# Patient Record
Sex: Male | Born: 1970 | Race: Black or African American | Hispanic: No | Marital: Married | Smoking: Never smoker
Health system: Southern US, Community
[De-identification: ages and names within clinical notes are randomized; demographics above are authoritative.]

## PROBLEM LIST (undated history)

## (undated) DIAGNOSIS — D649 Anemia, unspecified: Secondary | ICD-10-CM

## (undated) DIAGNOSIS — E0500 Thyrotoxicosis with diffuse goiter without thyrotoxic crisis or storm: Secondary | ICD-10-CM

## (undated) DIAGNOSIS — E079 Disorder of thyroid, unspecified: Secondary | ICD-10-CM

## (undated) DIAGNOSIS — I10 Essential (primary) hypertension: Secondary | ICD-10-CM

## (undated) HISTORY — DX: Thyrotoxicosis with diffuse goiter without thyrotoxic crisis or storm: E05.00

## (undated) HISTORY — PX: TOE AMPUTATION: SHX809

## (undated) HISTORY — PX: KNEE ARTHROSCOPY: SUR90

## (undated) HISTORY — PX: INGUINAL HERNIA REPAIR: SUR1180

## (undated) HISTORY — PX: HERNIA REPAIR: SHX51

## (undated) HISTORY — DX: Disorder of thyroid, unspecified: E07.9

## (undated) HISTORY — PX: SHOULDER ARTHROSCOPY: SHX128

---

## 2000-12-19 ENCOUNTER — Encounter: Payer: Self-pay | Admitting: Emergency Medicine

## 2000-12-19 ENCOUNTER — Emergency Department (HOSPITAL_COMMUNITY): Admission: EM | Admit: 2000-12-19 | Discharge: 2000-12-19 | Payer: Self-pay | Admitting: Emergency Medicine

## 2005-05-14 ENCOUNTER — Emergency Department (HOSPITAL_COMMUNITY): Admission: EM | Admit: 2005-05-14 | Discharge: 2005-05-14 | Payer: Self-pay | Admitting: Emergency Medicine

## 2007-01-18 ENCOUNTER — Emergency Department (HOSPITAL_COMMUNITY): Admission: EM | Admit: 2007-01-18 | Discharge: 2007-01-18 | Payer: Self-pay | Admitting: Emergency Medicine
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
## 2010-03-20 ENCOUNTER — Ambulatory Visit (HOSPITAL_COMMUNITY): Admission: RE | Admit: 2010-03-20 | Discharge: 2010-03-20 | Payer: Self-pay | Admitting: Surgery

## 2010-08-05 LAB — SURGICAL PCR SCREEN: Staphylococcus aureus: POSITIVE — AB

## 2013-12-10 DIAGNOSIS — Z0000 Encounter for general adult medical examination without abnormal findings: Secondary | ICD-10-CM | POA: Insufficient documentation

## 2014-01-14 DIAGNOSIS — K4090 Unilateral inguinal hernia, without obstruction or gangrene, not specified as recurrent: Secondary | ICD-10-CM | POA: Insufficient documentation

## 2014-01-14 DIAGNOSIS — M6740 Ganglion, unspecified site: Secondary | ICD-10-CM | POA: Insufficient documentation

## 2015-01-31 DIAGNOSIS — D171 Benign lipomatous neoplasm of skin and subcutaneous tissue of trunk: Secondary | ICD-10-CM | POA: Insufficient documentation

## 2017-03-16 ENCOUNTER — Other Ambulatory Visit: Payer: Self-pay | Admitting: Surgery

## 2017-04-18 SURGERY — Surgical Case
Anesthesia: *Unknown

## 2019-07-10 DIAGNOSIS — E0590 Thyrotoxicosis, unspecified without thyrotoxic crisis or storm: Secondary | ICD-10-CM | POA: Insufficient documentation

## 2019-10-12 ENCOUNTER — Other Ambulatory Visit: Payer: Self-pay | Admitting: Surgery

## 2019-12-19 NOTE — Progress Notes (Signed)
LVM to inform the pt of the Covid testing site change of location to [facility]. Please show up at the scheduled time, and this new site is still a drive- thru. Please contact your ordering provider with any other questions/concerns.

## 2019-12-26 ENCOUNTER — Encounter (HOSPITAL_COMMUNITY)
Admission: RE | Admit: 2019-12-26 | Discharge: 2019-12-26 | Disposition: A | Discharge status: Home / Self Care | Payer: BLUE CROSS/BLUE SHIELD | Source: Ambulatory Visit | Attending: Surgery | Admitting: Surgery

## 2019-12-26 ENCOUNTER — Other Ambulatory Visit: Payer: Self-pay

## 2019-12-26 ENCOUNTER — Encounter (HOSPITAL_COMMUNITY): Payer: Self-pay

## 2019-12-26 DIAGNOSIS — Z01818 Encounter for other preprocedural examination: Secondary | ICD-10-CM | POA: Diagnosis present

## 2019-12-26 HISTORY — DX: Essential (primary) hypertension: I10

## 2019-12-26 HISTORY — DX: Anemia, unspecified: D64.9

## 2019-12-26 LAB — BASIC METABOLIC PANEL
Anion gap: 8 (ref 5–15)
BUN: 9 mg/dL (ref 6–20)
CO2: 28 mmol/L (ref 22–32)
Calcium: 9.3 mg/dL (ref 8.9–10.3)
Chloride: 99 mmol/L (ref 98–111)
Creatinine, Ser: 1.04 mg/dL (ref 0.61–1.24)
GFR calc Af Amer: >60 mL/min (ref >60)
GFR calc non Af Amer: >60 mL/min (ref >60)
Glucose, Bld: 89 mg/dL (ref 70–99)
Potassium: 4 mmol/L (ref 3.5–5.1)
Sodium: 135 mmol/L (ref 135–145)

## 2019-12-26 LAB — CBC
HCT: 48.3 % (ref 39.0–52.0)
Hemoglobin: 15.7 g/dL (ref 13.0–17.0)
MCH: 26.7 pg (ref 26.0–34.0)
MCHC: 32.5 g/dL (ref 30.0–36.0)
MCV: 82.3 fL (ref 80.0–100.0)
Platelets: 224 10*3/uL (ref 150–400)
RBC: 5.87 MIL/uL — ABNORMAL HIGH (ref 4.22–5.81)
RDW: 14.6 % (ref 11.5–15.5)
WBC: 4.7 10*3/uL (ref 4.0–10.5)
nRBC: 0 % (ref 0.0–0.2)

## 2019-12-26 NOTE — Progress Notes (Signed)
PCP - Dr. Judene Companion    Cardiologist - Denies  Chest x-ray - Denies  EKG - 12/26/19  Stress Test - Denies  ECHO - Denies  Cardiac Cath - Denies  AICD-na PM-na LOOP-na  Sleep Study - Denies CPAP - None  LABS- 12/26/19: CBC, BMP 12/31/19: COVID  ASA- Denies  ERAS- Yes- Ensure given   HA1C- Denies   Anesthesia- Yes- EKG  Pt denies having chest pain, sob, or fever at this time. All instructions explained to the pt, with a verbal understanding of the material. Pt agrees to go over the instructions while at home for a better understanding. Pt also instructed to self quarantine after being tested for COVID-19. The opportunity to ask questions was provided.   Coronavirus Screening  Have you experienced the following symptoms:  Cough yes/no: No Fever (>100.13F)  yes/no: No Runny nose yes/no: No Sore throat yes/no: No Difficulty breathing/shortness of breath  yes/no: No  Have you or a family member traveled in the last 14 days and where? yes/no: No   If the patient indicates "YES" to the above questions, their PAT will be rescheduled to limit the exposure to others and, the surgeon will be notified. THE PATIENT WILL NEED TO BE ASYMPTOMATIC FOR 14 DAYS.   If the patient is not experiencing any of these symptoms, the PAT nurse will instruct them to NOT bring anyone with them to their appointment since they may have these symptoms or traveled as well.   Please remind your patients and families that hospital visitation restrictions are in effect and the importance of the restrictions.

## 2019-12-26 NOTE — Pre-Procedure Instructions (Signed)
[id number] - [place], [place] - [address] AT [facility] [address] [place] [address] Phone: [phone] Fax: [phone]    Your procedure is scheduled on Thurs., Aug. 12, 2021 from 7:30AM-8:30AM  Report to [facility] Entrance "A" at 5:30AM  Call this number if you have problems the morning of surgery:  [phone]   Remember:  Do not eat after midnight on Aug. 11th  You may drink clear liquids until 3 hours (4:30AM) prior to surgery time.  Clear liquids allowed are: Water, Juice (non-citric and without pulp - diabetics please choose diet or no sugar options), Carbonated beverages - (diabetics please choose diet or no sugar options), Clear Tea, Black Coffee only (no creamer, milk or cream including half and half), Plain Jell-O only (diabetics please choose diet or no sugar options), Gatorade (diabetics please choose diet or no sugar options) and Plain Popsicles only   Please complete your PRE-SURGERY ENSURE drink that was provided to you by 4:30AM the morning of surgery.  Please, if able, drink it in one setting. DO NOT SIP.     Take these medicines the morning of surgery with A SIP OF WATER: Methimazole (TAPAZOLE)  7 days before surgery (12/27/19), STOP taking all Aspirin (unless instructed by your doctor) and Other Aspirin containing products, Vitamins, Fish oils, and Herbal medications. Also stop all NSAIDS i.e. Advil, Ibuprofen, Motrin, Aleve, Anaprox, Naproxen, BC, Goody Powders, and all Supplements.  No Smoking of any kind, Tobacco, or Alcohol products 24 hours prior to your procedure. If you use a Cpap at night, you may bring all equipment for your overnight stay.    Special instructions:   [facility]- Preparing For Surgery  Before surgery, you can play an important role. Because skin is not sterile, your skin needs to be as free of germs as possible. You can reduce the number of germs on your skin by washing  with CHG (chlorahexidine gluconate) Soap before surgery.  CHG is an antiseptic cleaner which kills germs and bonds with the skin to continue killing germs even after washing.    Please do not use if you have an allergy to CHG or antibacterial soaps. If your skin becomes reddened/irritated stop using the CHG.  Do not shave (including legs and underarms) for at least 48 hours prior to first CHG shower. It is OK to shave your face.  Please follow these instructions carefully.   1. Shower the NIGHT BEFORE SURGERY and the MORNING OF SURGERY with CHG.   2. If you chose to wash your hair, wash your hair first as usual with your normal shampoo.  3. After you shampoo, rinse your hair and body thoroughly to remove the shampoo.  4. Use CHG as you would any other liquid soap. You can apply CHG directly to the skin and wash gently with a scrungie or a clean washcloth.   5. Apply the CHG Soap to your body ONLY FROM THE NECK DOWN.  Do not use on open wounds or open sores. Avoid contact with your eyes, ears, mouth and genitals (private parts). Wash Face and genitals (private parts)  with your normal soap.  6. Wash thoroughly, paying special attention to the area where your surgery will be performed.  7. Thoroughly rinse your body with warm water from the neck down.  8. DO NOT shower/wash with your normal soap after using and rinsing off the CHG Soap.  9. Pat yourself dry with a CLEAN TOWEL.  10. Wear CLEAN PAJAMAS to bed the night before surgery, wear comfortable clothes the morning of surgery  11. Place CLEAN SHEETS on your bed the night of your first shower and DO NOT SLEEP WITH PETS.   Day of Surgery:           Remember to brush your teeth WITH YOUR REGULAR TOOTHPASTE.  Do not wear jewelry.  Do not wear lotions, powders, colognes, or deodorant.  Do not shave 48 hours prior to surgery.  Men may shave face and neck.  Do not bring valuables to the hospital.  [facility] is not responsible for any  belongings or valuables.  Contacts, dentures or bridgework may not be worn into surgery.    For patients admitted to the hospital, discharge time will be determined by your treatment team.  Patients discharged the day of surgery will not be allowed to drive home, and someone age 57 and over needs to stay with them for 24 hours.  Please wear clean clothes to the hospital/surgery center.    Please read over the following fact sheets that you were given.

## 2019-12-31 ENCOUNTER — Other Ambulatory Visit (HOSPITAL_COMMUNITY)
Admission: RE | Admit: 2019-12-31 | Discharge: 2019-12-31 | Disposition: A | Discharge status: Home / Self Care | Payer: BC Managed Care – PPO | Source: Ambulatory Visit | Attending: Surgery | Admitting: Surgery

## 2019-12-31 DIAGNOSIS — Z01812 Encounter for preprocedural laboratory examination: Secondary | ICD-10-CM | POA: Insufficient documentation

## 2019-12-31 DIAGNOSIS — Z20822 Contact with and (suspected) exposure to covid-19: Secondary | ICD-10-CM | POA: Diagnosis not present

## 2019-12-31 LAB — SARS CORONAVIRUS 2 (TAT 6-24 HRS): SARS Coronavirus 2: NEGATIVE

## 2020-01-02 ENCOUNTER — Encounter (HOSPITAL_COMMUNITY): Payer: Self-pay | Admitting: Surgery

## 2020-01-02 NOTE — Anesthesia Preprocedure Evaluation (Addendum)
Anesthesia Evaluation  Patient identified by MRN, date of birth, ID band  Reviewed: NPO status , Patient's Chart, lab work & pertinent test results  Airway Mallampati: I  TM Distance: >3 FB Neck ROM: Full    Dental no notable dental hx.    Pulmonary neg pulmonary ROS,    Pulmonary exam normal breath sounds clear to auscultation       Cardiovascular Exercise Tolerance: Good hypertension, Normal cardiovascular exam Rhythm:Regular Rate:Normal  Normal sinus rhythm Incomplete right bundle branch block Borderline ECG No previous tracing Confirmed by Armanda Magic ([id number]) on 12/26/2019 8:33:03 PM   Neuro/Psych negative neurological ROS     GI/Hepatic Inguinal hernia   Endo/Other  negative endocrine ROS  Renal/GU negative Renal ROS  negative genitourinary   Musculoskeletal negative musculoskeletal ROS (+)   Abdominal   Peds  Hematology negative hematology ROS (+)   Anesthesia Other Findings   Reproductive/Obstetrics                            Anesthesia Physical Anesthesia Plan  ASA: II  Anesthesia Plan: General and Regional   Post-op Pain Management: GA combined w/ Regional for post-op pain   Induction: Intravenous  PONV Risk Score and Plan: 2 and Ondansetron, Midazolam and Dexamethasone  Airway Management Planned: Oral ETT  Additional Equipment:   Intra-op Plan:   Post-operative Plan: Extubation in OR  Informed Consent: I have reviewed the patients History and Physical, chart, labs and discussed the procedure including the risks, benefits and alternatives for the proposed anesthesia with the patient or authorized representative who has indicated his/her understanding and acceptance.     Dental advisory given  Plan Discussed with: CRNA  Anesthesia Plan Comments:        Anesthesia Quick Evaluation

## 2020-01-02 NOTE — H&P (Signed)
   Kathrynn Humble  Location: [facility] Patient #: [id number] DOB: [birth year] Married / Language: English / Race: Black or African American Male   History of Present Illness The patient is a 49 year old male who presents with an inguinal hernia.  Chief complaint: Possible recurrent right hernia  This is a 49 year old gentleman performed laparoscopic right inguinal hernia repair with mesh back in 2011 for an indirect hernia. He then developed a left inguinal hernia which I repaired open with mesh in 2018. Over the last several months, he has been having increasing discomfort and a bulge in the right inguinal area. He regularly does normal lifting. He reports mild discomfort and no obstructive symptoms. He is otherwise healthy without complaints. He has had no significant change in his medical care since I saw him last in 2019.   Allergies  No Known Drug Allergies  [03/16/2017]: Allergies Reconciled   Medication History methIMAzole (10MG  Tablet, Oral) Active. hydroCHLOROthiazide (25MG  Tablet, Oral) Active. Medications Reconciled  Vitals   Weight: 215.2 lb Height: 74in Body Surface Area: 2.24 m Body Mass Index: 27.63 kg/m  Temp.: 97.57F  Pulse: 90 (Regular)  BP: 128/84(Sitting, Left Arm, Standard)       Physical Exam  The physical exam findings are as follows: Note: Generally, he appears well.  His abdomen is soft and nontender.  He does have a small, direct right inguinal hernia. There is no evidence of recurrent left inguinal hernia. His right inguinal hernia is easily reducible.    Assessment & Plan  RECURRENT RIGHT INGUINAL HERNIA (K40.91)  Impression: I discussed the diagnosis with him in detail. He has had previous hernia surgery so he is well aware of the diagnosis. This feels like a direct hernia and not a recurrent indirect. I recommended open right inguinal hernia repair with mesh as he has had a previous laparoscopic  repair. I discussed the surgical procedure was detail. I discussed the risks which includes limited to bleeding, infection, injury to surrounding structures, use of mesh, hernia recurrence, chronic pain, cardiopulmonary issues, postoperative recovery, etc. He understands and wished to proceed with surgery which will be scheduled.

## 2020-01-03 ENCOUNTER — Encounter (HOSPITAL_COMMUNITY): Payer: Self-pay | Admitting: Surgery

## 2020-01-03 ENCOUNTER — Encounter (HOSPITAL_COMMUNITY)
Admission: RE | Disposition: A | Discharge status: Home / Self Care | Payer: Self-pay | Source: Ambulatory Visit | Attending: Surgery

## 2020-01-03 ENCOUNTER — Other Ambulatory Visit: Payer: Self-pay

## 2020-01-03 ENCOUNTER — Ambulatory Visit (HOSPITAL_COMMUNITY): Payer: BC Managed Care – PPO | Admitting: Physician Assistant

## 2020-01-03 ENCOUNTER — Ambulatory Visit (HOSPITAL_COMMUNITY): Payer: BC Managed Care – PPO | Admitting: Anesthesiology

## 2020-01-03 ENCOUNTER — Ambulatory Visit (HOSPITAL_COMMUNITY)
Admission: RE | Admit: 2020-01-03 | Discharge: 2020-01-03 | Disposition: A | Discharge status: Home / Self Care | Payer: BC Managed Care – PPO | Source: Ambulatory Visit | Attending: Surgery | Admitting: Surgery

## 2020-01-03 DIAGNOSIS — I4510 Unspecified right bundle-branch block: Secondary | ICD-10-CM | POA: Insufficient documentation

## 2020-01-03 DIAGNOSIS — I10 Essential (primary) hypertension: Secondary | ICD-10-CM | POA: Insufficient documentation

## 2020-01-03 DIAGNOSIS — K4091 Unilateral inguinal hernia, without obstruction or gangrene, recurrent: Secondary | ICD-10-CM | POA: Insufficient documentation

## 2020-01-03 DIAGNOSIS — Z79899 Other long term (current) drug therapy: Secondary | ICD-10-CM | POA: Diagnosis not present

## 2020-01-03 HISTORY — PX: INGUINAL HERNIA REPAIR: SHX194

## 2020-01-03 HISTORY — PX: INSERTION OF MESH: SHX5868

## 2020-01-03 LAB — SURGICAL PCR SCREEN
MRSA, PCR: NEGATIVE
Staphylococcus aureus: POSITIVE — AB

## 2020-01-03 SURGERY — REPAIR, HERNIA, INGUINAL, ADULT
Anesthesia: Regional | Site: Groin | Laterality: Right

## 2020-01-03 MED ORDER — LIDOCAINE 2% (20 MG/ML) 5 ML SYRINGE
INTRAMUSCULAR | Status: DC | PRN
  Administered 2020-01-03: 100 mg via INTRAVENOUS

## 2020-01-03 MED ORDER — CELECOXIB 200 MG PO CAPS: 400.0000 mg | ORAL_CAPSULE | ORAL | Status: AC

## 2020-01-03 MED ORDER — PROPOFOL 10 MG/ML IV BOLUS
INTRAVENOUS | Status: DC | PRN
  Administered 2020-01-03: 200 mg via INTRAVENOUS

## 2020-01-03 MED ORDER — DEXAMETHASONE SODIUM PHOSPHATE 10 MG/ML IJ SOLN
INTRAMUSCULAR | Status: DC | PRN
  Administered 2020-01-03: 5 mg via INTRAVENOUS

## 2020-01-03 MED ORDER — CEFAZOLIN SODIUM-DEXTROSE 2-4 GM/100ML-% IV SOLN
2.0000 g | INTRAVENOUS | Status: AC
  Administered 2020-01-03: 2 g via INTRAVENOUS

## 2020-01-03 MED ORDER — BUPIVACAINE-EPINEPHRINE 0.5% -1:200000 IJ SOLN
INTRAMUSCULAR | Status: AC
  Filled 2020-01-03: qty 1

## 2020-01-03 MED ORDER — CHLORHEXIDINE GLUCONATE 0.12 % MT SOLN: 15.0000 mL | Freq: Once | OROMUCOSAL | Status: AC

## 2020-01-03 MED ORDER — CHLORHEXIDINE GLUCONATE CLOTH 2 % EX PADS: 6.0000 | MEDICATED_PAD | Freq: Once | CUTANEOUS | Status: DC

## 2020-01-03 MED ORDER — ONDANSETRON HCL 4 MG/2ML IJ SOLN
INTRAMUSCULAR | Status: DC | PRN
  Administered 2020-01-03: 4 mg via INTRAVENOUS

## 2020-01-03 MED ORDER — CHLORHEXIDINE GLUCONATE 0.12 % MT SOLN
OROMUCOSAL | Status: AC
  Administered 2020-01-03: 15 mL via OROMUCOSAL
  Filled 2020-01-03: qty 15

## 2020-01-03 MED ORDER — PHENYLEPHRINE HCL (PRESSORS) 10 MG/ML IV SOLN
INTRAVENOUS | Status: DC | PRN
Start: 2020-01-03 — End: 2020-01-03
  Administered 2020-01-03: 80 ug via INTRAVENOUS

## 2020-01-03 MED ORDER — PROPOFOL 10 MG/ML IV BOLUS
INTRAVENOUS | Status: AC
  Filled 2020-01-03: qty 40

## 2020-01-03 MED ORDER — GABAPENTIN 300 MG PO CAPS: 300.0000 mg | ORAL_CAPSULE | ORAL | Status: AC

## 2020-01-03 MED ORDER — LACTATED RINGERS IV SOLN: INTRAVENOUS | Status: DC

## 2020-01-03 MED ORDER — MIDAZOLAM HCL 5 MG/5ML IJ SOLN
INTRAMUSCULAR | Status: DC | PRN
  Administered 2020-01-03: 2 mg via INTRAVENOUS

## 2020-01-03 MED ORDER — ACETAMINOPHEN 500 MG PO TABS
ORAL_TABLET | ORAL | Status: AC
  Administered 2020-01-03: 1000 mg via ORAL
  Filled 2020-01-03: qty 2

## 2020-01-03 MED ORDER — DEXAMETHASONE SODIUM PHOSPHATE 10 MG/ML IJ SOLN
INTRAMUSCULAR | Status: AC
  Filled 2020-01-03: qty 1

## 2020-01-03 MED ORDER — LIDOCAINE 2% (20 MG/ML) 5 ML SYRINGE
INTRAMUSCULAR | Status: AC
  Filled 2020-01-03: qty 5

## 2020-01-03 MED ORDER — MIDAZOLAM HCL 2 MG/2ML IJ SOLN
INTRAMUSCULAR | Status: AC
  Filled 2020-01-03: qty 2

## 2020-01-03 MED ORDER — CEFAZOLIN SODIUM-DEXTROSE 2-4 GM/100ML-% IV SOLN
INTRAVENOUS | Status: AC
  Filled 2020-01-03: qty 100

## 2020-01-03 MED ORDER — FENTANYL CITRATE (PF) 250 MCG/5ML IJ SOLN
INTRAMUSCULAR | Status: DC | PRN
  Administered 2020-01-03: 50 ug via INTRAVENOUS
  Administered 2020-01-03: 100 ug via INTRAVENOUS

## 2020-01-03 MED ORDER — FENTANYL CITRATE (PF) 250 MCG/5ML IJ SOLN
INTRAMUSCULAR | Status: AC
  Filled 2020-01-03: qty 5

## 2020-01-03 MED ORDER — ACETAMINOPHEN 500 MG PO TABS: 1000.0000 mg | ORAL_TABLET | ORAL | Status: AC

## 2020-01-03 MED ORDER — 0.9 % SODIUM CHLORIDE (POUR BTL) OPTIME
TOPICAL | Status: DC | PRN
  Administered 2020-01-03: 1000 mL

## 2020-01-03 MED ORDER — BUPIVACAINE-EPINEPHRINE 0.5% -1:200000 IJ SOLN
INTRAMUSCULAR | Status: DC | PRN
  Administered 2020-01-03: 10 mL

## 2020-01-03 MED ORDER — ENSURE PRE-SURGERY PO LIQD: 296.0000 mL | Freq: Once | ORAL | Status: DC

## 2020-01-03 MED ORDER — ORAL CARE MOUTH RINSE: 15.0000 mL | Freq: Once | OROMUCOSAL | Status: AC

## 2020-01-03 MED ORDER — GABAPENTIN 300 MG PO CAPS
ORAL_CAPSULE | ORAL | Status: AC
  Administered 2020-01-03: 300 mg via ORAL
  Filled 2020-01-03: qty 1

## 2020-01-03 MED ORDER — CELECOXIB 200 MG PO CAPS
ORAL_CAPSULE | ORAL | Status: AC
  Administered 2020-01-03: 400 mg via ORAL
  Filled 2020-01-03: qty 2

## 2020-01-03 MED ORDER — ONDANSETRON HCL 4 MG/2ML IJ SOLN
INTRAMUSCULAR | Status: AC
  Filled 2020-01-03: qty 2

## 2020-01-03 MED ORDER — OXYCODONE HCL 5 MG PO TABS: 5.0000 mg | ORAL_TABLET | Freq: Four times a day (QID) | ORAL | 0 refills | Status: DC | PRN

## 2020-01-03 MED ORDER — BUPIVACAINE HCL (PF) 0.5 % IJ SOLN
INTRAMUSCULAR | Status: DC | PRN
  Administered 2020-01-03: 30 mL

## 2020-01-03 SURGICAL SUPPLY — 35 items
ADH SKN CLS APL DERMABOND .7 (GAUZE/BANDAGES/DRESSINGS) ×1
APL PRP STRL LF DISP 70% ISPRP (MISCELLANEOUS) ×1
BLADE CLIPPER SURG (BLADE) IMPLANT
CHLORAPREP W/TINT 26 (MISCELLANEOUS) ×3 IMPLANT
COVER SURGICAL LIGHT HANDLE (MISCELLANEOUS) ×3 IMPLANT
COVER WAND RF STERILE (DRAPES) IMPLANT
DERMABOND ADVANCED (GAUZE/BANDAGES/DRESSINGS) ×2
DERMABOND ADVANCED .7 DNX12 (GAUZE/BANDAGES/DRESSINGS) ×1 IMPLANT
DRAIN PENROSE 1/2X12 LTX STRL (WOUND CARE) ×2 IMPLANT
DRAPE LAPAROTOMY TRNSV 102X78 (DRAPES) ×3 IMPLANT
ELECT REM PT RETURN 9FT ADLT (ELECTROSURGICAL) ×3
ELECTRODE REM PT RTRN 9FT ADLT (ELECTROSURGICAL) ×1 IMPLANT
GLOVE SURG SIGNA 7.5 PF LTX (GLOVE) ×3 IMPLANT
GOWN STRL REUS W/ TWL LRG LVL3 (GOWN DISPOSABLE) ×1 IMPLANT
GOWN STRL REUS W/ TWL XL LVL3 (GOWN DISPOSABLE) ×1 IMPLANT
GOWN STRL REUS W/TWL LRG LVL3 (GOWN DISPOSABLE) ×3
GOWN STRL REUS W/TWL XL LVL3 (GOWN DISPOSABLE) ×3
KIT BASIN OR (CUSTOM PROCEDURE TRAY) ×3 IMPLANT
KIT TURNOVER KIT B (KITS) ×3 IMPLANT
MESH PARIETEX PROGRIP RIGHT (Mesh General) ×2 IMPLANT
NDL HYPO 25GX1X1/2 BEV (NEEDLE) ×1 IMPLANT
NEEDLE HYPO 25GX1X1/2 BEV (NEEDLE) ×3 IMPLANT
NS IRRIG 1000ML POUR BTL (IV SOLUTION) ×3 IMPLANT
PACK GENERAL/GYN (CUSTOM PROCEDURE TRAY) ×3 IMPLANT
PAD ARMBOARD 7.5X6 YLW CONV (MISCELLANEOUS) ×3 IMPLANT
PENCIL SMOKE EVACUATOR (MISCELLANEOUS) ×3 IMPLANT
SUT MON AB 4-0 PC3 18 (SUTURE) ×3 IMPLANT
SUT SILK 2 0 SH (SUTURE) ×2 IMPLANT
SUT VIC AB 2-0 CT1 27 (SUTURE) ×6
SUT VIC AB 2-0 CT1 TAPERPNT 27 (SUTURE) ×1 IMPLANT
SUT VIC AB 3-0 CT1 27 (SUTURE) ×3
SUT VIC AB 3-0 CT1 TAPERPNT 27 (SUTURE) ×1 IMPLANT
SYR CONTROL 10ML LL (SYRINGE) ×3 IMPLANT
TOWEL GREEN STERILE (TOWEL DISPOSABLE) ×3 IMPLANT
TOWEL GREEN STERILE FF (TOWEL DISPOSABLE) ×3 IMPLANT

## 2020-01-03 NOTE — Op Note (Signed)
OPEN RIGHT INGUINAL HERNIA REPAIR, INSERTION OF MESH  Procedure Note  Steve Woods 01/03/2020   Pre-op Diagnosis: RECURRENT RIGHT INGUINAL HERNIA     Post-op Diagnosis: same  Procedure(s): OPEN RIGHT INGUINAL HERNIA REPAIR INSERTION OF MESH  Surgeon(s): Abigail Miyamoto, MD  Anesthesia: General  Staff:  Circulator: Rogers Seeds, RN Scrub Person: Angelena Form Float Surgical Tech: Francesco Sor, CST  Estimated Blood Loss: Minimal               Findings: The patient was found to have recurrent indirect right inguinal hernia which was repaired with a large piece of Prolene ProGrip mesh from [employer]  Procedure: The patient was brought to the operating room and identifies correct patient.  He is placed upon the operating room table and general anesthesia was induced.  He had received a preoperative right-sided tap block by anesthesiology.  His abdomen was prepped and draped in usual sterile fashion.  I anesthetized the skin in the right inguinal area with Marcaine.  I then made a longitudinal incision with a scalpel.  I dissected down through Scarpa's fascia with electrocautery.  I then identified the external beak fascia and opened it toward the internal and external rings.  I then identified the testicular cord and structures and controlled with a Penrose drain.  The patient was found to have an indirect hernia.  I separated the sac from the surrounding cord structures and dissected down to its exit from the internal ring.  All contents have been reduced from the sac.  I tied off the base with a 2-0 silk suture and then excised the redundant sac with the cautery.  Next a large piece of Prolene ProGrip mesh was brought to the field.  I placed that against the pubic tubercle and then brought around the cord structures.  I then sutured the mesh in place with several interrupted 2-0 Vicryl sutures.  Wide coverage of the inguinal floor as well as the internal ring appeared to be achieved.   I then closed the external beak fascia over the top of the mesh with a running 2-0 Vicryl suture.  Scarpa's fascia was then closed interrupted 3-0 Vicryl sutures and the skin was closed with a running 4-0 Monocryl.  Dermabond was then applied.  The patient tolerated the procedure well.  All the counts were correct at the end of the procedure.  The patient was then extubated in the operating room and taken in a stable condition to the recovery room.          Abigail Miyamoto   Date: 01/03/2020  Time: 8:04 AM

## 2020-01-03 NOTE — Transfer of Care (Signed)
Immediate Anesthesia Transfer of Care Note  Patient: Steve Woods  Procedure(s) Performed: OPEN RIGHT INGUINAL HERNIA REPAIR (Right Groin) INSERTION OF MESH (Right Groin)  Patient Location: PACU  Anesthesia Type:GA combined with regional for post-op pain  Level of Consciousness: drowsy and patient cooperative  Airway & Oxygen Therapy: Patient Spontanous Breathing and Patient connected to nasal cannula oxygen  Post-op Assessment: Report given to RN and Post -op Vital signs reviewed and stable  Post vital signs: Reviewed and stable  Last Vitals:  Vitals Value Taken Time  BP 127/89 01/03/20 0812  Temp    Pulse 70 01/03/20 0815  Resp 13 01/03/20 0815  SpO2 100 % 01/03/20 0815  Vitals shown include unvalidated device data.  Last Pain:  Vitals:   01/03/20 0703  TempSrc:   PainSc: 0-No pain         Complications: No complications documented.

## 2020-01-03 NOTE — Interval H&P Note (Signed)
History and Physical Interval Note:no change in H and P  01/03/2020 7:06 AM  Steve Woods  has presented today for surgery, with the diagnosis of RECURRENT RIGHT INGUINAL HERNIA.  The various methods of treatment have been discussed with the patient and family. After consideration of risks, benefits and other options for treatment, the patient has consented to  Procedure(s) with comments: OPEN RIGHT INGUINAL HERNIA REPAIR WITH MESH (Right) - LMA, TAP BLOCK as a surgical intervention.  The patient's history has been reviewed, patient examined, no change in status, stable for surgery.  I have reviewed the patient's chart and labs.  Questions were answered to the patient's satisfaction.     Steve Woods

## 2020-01-03 NOTE — Discharge Instructions (Signed)
[facility]  UMBILICAL OR INGUINAL HERNIA REPAIR: POST OP INSTRUCTIONS  Always review your discharge instruction sheet given to you by the facility where your surgery was performed. IF YOU HAVE DISABILITY OR FAMILY LEAVE FORMS, YOU MUST BRING THEM TO THE OFFICE FOR PROCESSING.   DO NOT GIVE THEM TO YOUR DOCTOR.  1. A  prescription for pain medication may be given to you upon discharge.  Take your pain medication as prescribed, if needed.  If narcotic pain medicine is not needed, then you may take acetaminophen (Tylenol) or ibuprofen (Advil) as needed. 2. Take your usually prescribed medications unless otherwise directed. If you need a refill on your pain medication, please contact your pharmacy.  They will contact our office to request authorization. Prescriptions will not be filled after 5 pm or on week-ends. 3. You should follow a light diet the first 24 hours after arrival home, such as soup and crackers, etc.  Be sure to include lots of fluids daily.  Resume your normal diet the day after surgery. 4.Most patients will experience some swelling and bruising around the umbilicus or in the groin and scrotum.  Ice packs and reclining will help.  Swelling and bruising can take several days to resolve.  6. It is common to experience some constipation if taking pain medication after surgery.  Increasing fluid intake and taking a stool softener (such as Colace) will usually help or prevent this problem from occurring.  A mild laxative (Milk of Magnesia or Miralax) should be taken according to package directions if there are no bowel movements after 48 hours. 7. Unless discharge instructions indicate otherwise, you may remove your bandages 24-48 hours after surgery, and you may shower at that time.  You may have steri-strips (small skin tapes) in place directly over the incision.  These strips should be left on the skin for 7-10 days.  If your surgeon used skin glue on the  incision, you may shower in 24 hours.  The glue will flake off over the next 2-3 weeks.  Any sutures or staples will be removed at the office during your follow-up visit. 8. ACTIVITIES:  You may resume regular (light) daily activities beginning the next day--such as daily self-care, walking, climbing stairs--gradually increasing activities as tolerated.  You may have sexual intercourse when it is comfortable.  Refrain from any heavy lifting or straining until approved by your doctor.  a.You may drive when you are no longer taking prescription pain medication, you can comfortably wear a seatbelt, and you can safely maneuver your car and apply brakes. b.RETURN TO WORK:   _____________________________________________  9.You should see your doctor in the office for a follow-up appointment approximately 2-3 weeks after your surgery.  Make sure that you call for this appointment within a day or two after you arrive home to insure a convenient appointment time. 10.OTHER INSTRUCTIONS: _OK TO SHOWER STARTING TOMORROW ICE PACK, TYLENOL, AND IBUPROFEN ALSO FOR PAIN NO LIFTING MORE THAN 15 TO 20 POUNDS FOR 4 WEEKS________________________    _____________________________________  WHEN TO CALL YOUR DOCTOR: 1. Fever over 101.0 2. Inability to urinate 3. Nausea and/or vomiting 4. Extreme swelling or bruising 5. Continued bleeding from incision. 6. Increased pain, redness, or drainage from the incision  The clinic staff is available to answer your questions during regular business hours.  Please don't hesitate to call and ask to speak to one of the nurses for clinical concerns.  If you have a medical emergency, go to   the nearest emergency room or call 911.  A surgeon from [facility] is always on call at the hospital   [address], [address], [place], [place]  [address] ?  [address] [address], [place], [place]   [address] [phone] ? [phone] ? FAX [phone] Web site:  [email or web address] 

## 2020-01-03 NOTE — Anesthesia Procedure Notes (Signed)
Anesthesia Regional Block: TAP block   Pre-Anesthetic Checklist: ,, timeout performed, Correct Patient, Correct Site, Correct Laterality, Correct Procedure, Correct Position, site marked, Risks and benefits discussed,  Surgical consent,  Pre-op evaluation,  At surgeon's request and post-op pain management  Laterality: Right  Prep: chloraprep       Needles:  Injection technique: Single-shot  Needle Type: Stimiplex     Needle Length: 9cm  Needle Gauge: 21     Additional Needles:   Procedures:,,,, ultrasound used (permanent image in chart),,,,  Narrative:  Start time: 01/03/2020 7:05 AM End time: 01/03/2020 7:10 AM Injection made incrementally with aspirations every 5 mL. Anesthesiologist: Mellody Dance, MD

## 2020-01-03 NOTE — Anesthesia Postprocedure Evaluation (Signed)
Anesthesia Post Note  Patient: Steve Woods  Procedure(s) Performed: OPEN RIGHT INGUINAL HERNIA REPAIR (Right Groin) INSERTION OF MESH (Right Groin)     Patient location during evaluation: PACU Anesthesia Type: Regional and General Level of consciousness: awake and alert Pain management: pain level controlled Vital Signs Assessment: post-procedure vital signs reviewed and stable Respiratory status: spontaneous breathing, nonlabored ventilation, respiratory function stable and patient connected to nasal cannula oxygen Cardiovascular status: blood pressure returned to baseline and stable Postop Assessment: no apparent nausea or vomiting Anesthetic complications: no   No complications documented.  Last Vitals:  Vitals:   01/03/20 0920 01/03/20 0921  BP:  114/79  Pulse: 72 69  Resp: 16 14  Temp:    SpO2: 100% 100%    Last Pain:  Vitals:   01/03/20 0830  TempSrc:   PainSc: Asleep                 Mellody Dance

## 2020-01-03 NOTE — Anesthesia Procedure Notes (Signed)
Procedure Name: LMA Insertion Date/Time: 01/03/2020 7:29 AM Performed by: Adria Dill, CRNA Pre-anesthesia Checklist: Patient identified, Suction available, Patient being monitored and Emergency Drugs available Patient Re-evaluated:Patient Re-evaluated prior to induction Oxygen Delivery Method: Circle system utilized Preoxygenation: Pre-oxygenation with 100% oxygen Induction Type: IV induction LMA: LMA inserted LMA Size: 5.0 Number of attempts: 1 Dental Injury: Teeth and Oropharynx as per pre-operative assessment  Comments: LMA insertion by Charmayne Sheer., SRNA under direct supervision.

## 2020-01-04 ENCOUNTER — Encounter (HOSPITAL_COMMUNITY): Payer: Self-pay | Admitting: Surgery

## 2020-02-05 DIAGNOSIS — I10 Essential (primary) hypertension: Secondary | ICD-10-CM | POA: Insufficient documentation

## 2022-06-04 DIAGNOSIS — E0590 Thyrotoxicosis, unspecified without thyrotoxic crisis or storm: Secondary | ICD-10-CM | POA: Diagnosis not present

## 2022-08-13 DIAGNOSIS — E0590 Thyrotoxicosis, unspecified without thyrotoxic crisis or storm: Secondary | ICD-10-CM | POA: Diagnosis not present

## 2022-08-23 DIAGNOSIS — E0500 Thyrotoxicosis with diffuse goiter without thyrotoxic crisis or storm: Secondary | ICD-10-CM | POA: Diagnosis not present

## 2022-10-20 DIAGNOSIS — I10 Essential (primary) hypertension: Secondary | ICD-10-CM | POA: Diagnosis not present

## 2022-10-20 DIAGNOSIS — E0590 Thyrotoxicosis, unspecified without thyrotoxic crisis or storm: Secondary | ICD-10-CM | POA: Diagnosis not present

## 2022-10-20 DIAGNOSIS — Z0001 Encounter for general adult medical examination with abnormal findings: Secondary | ICD-10-CM | POA: Diagnosis not present

## 2022-10-28 DIAGNOSIS — I10 Essential (primary) hypertension: Secondary | ICD-10-CM | POA: Diagnosis not present

## 2022-12-20 DIAGNOSIS — E7800 Pure hypercholesterolemia, unspecified: Secondary | ICD-10-CM | POA: Diagnosis not present

## 2022-12-20 DIAGNOSIS — Z91148 Patient's other noncompliance with medication regimen for other reason: Secondary | ICD-10-CM | POA: Diagnosis not present

## 2022-12-20 DIAGNOSIS — R030 Elevated blood-pressure reading, without diagnosis of hypertension: Secondary | ICD-10-CM | POA: Diagnosis not present

## 2023-01-21 DIAGNOSIS — E7800 Pure hypercholesterolemia, unspecified: Secondary | ICD-10-CM | POA: Diagnosis not present

## 2023-04-29 DIAGNOSIS — E0500 Thyrotoxicosis with diffuse goiter without thyrotoxic crisis or storm: Secondary | ICD-10-CM | POA: Diagnosis not present

## 2023-08-05 DIAGNOSIS — E0500 Thyrotoxicosis with diffuse goiter without thyrotoxic crisis or storm: Secondary | ICD-10-CM | POA: Diagnosis not present

## 2023-11-08 ENCOUNTER — Encounter: Payer: Self-pay | Admitting: Family Medicine

## 2023-11-08 ENCOUNTER — Ambulatory Visit: Payer: BC Managed Care – PPO | Admitting: Family Medicine

## 2023-11-08 VITALS — BP 132/82 | HR 69 | Temp 98.2°F | Ht 74.0 in | Wt 240.0 lb

## 2023-11-08 DIAGNOSIS — Z23 Encounter for immunization: Secondary | ICD-10-CM | POA: Diagnosis not present

## 2023-11-08 DIAGNOSIS — Z0001 Encounter for general adult medical examination with abnormal findings: Secondary | ICD-10-CM

## 2023-11-08 DIAGNOSIS — L609 Nail disorder, unspecified: Secondary | ICD-10-CM | POA: Diagnosis not present

## 2023-11-08 DIAGNOSIS — Z0000 Encounter for general adult medical examination without abnormal findings: Secondary | ICD-10-CM | POA: Diagnosis not present

## 2023-11-08 DIAGNOSIS — E0590 Thyrotoxicosis, unspecified without thyrotoxic crisis or storm: Secondary | ICD-10-CM

## 2023-11-08 DIAGNOSIS — E0500 Thyrotoxicosis with diffuse goiter without thyrotoxic crisis or storm: Secondary | ICD-10-CM

## 2023-11-08 NOTE — Progress Notes (Addendum)
 Complete physical exam  Assessment & Plan:    Routine Health Maintenance and Physical Exam Discussed health benefits of physical activity, and encouraged him to engage in regular exercise appropriate for his age and condition.  Preventative health care -     CBC with Differential/Platelet -     Lipid panel -     TSH -     PSA -     Comprehensive metabolic panel with GFR  Need for Tdap vaccination -     Tdap vaccine greater than or equal to 54yo IM  Hyperthyroidism -     TSH  Graves disease -     TSH  Nail disorder -     Ambulatory referral to Dermatology   Test results were reviewed and analyzed as part of the medical decision making of this visit.  Reviewed previous notes from [facility], endocrinology notes and lab work during the office visit.  Tetanus updated today.  Patient declines the Shingrix vaccine.  Dermatology consult ordered. EKG- NSR, no acute changes noted, no significant change compared to previous EKG 2021 (has incomplete RBBB) Recommend healthy diet i.e mediterranean/DASH diet, consistent exercise - 30 minutes 5 day per week, and gradual weight loss. Dermatology consult ordered re nail abnormality.  No follow-ups on file.        Subjective:  Patient ID: Steve Woods, male    DOB: 18-Jan-1971  Age: 53 y.o. MRN: [id number] Chief Complaint  Patient presents with   Annual Exam    Steve Woods is a 53 y.o. male who presents today for a complete physical exam. Colonoscopy-UTD 2021 Tetanus 2015 Shingrix declines Steve Woods is a 53 year old male who presents for an annual physical exam.  He is in remission from Graves' disease and hyperthyroidism. He was scheduled for a one-year follow-up with his endocrinologist last March but canceled due to personal reasons. He has not experienced any recent symptoms related to his thyroid condition.  He is experiencing significant stress due to family issues. His daughter underwent surgery for  fibroids in March, and his brother is currently in the hospital with end stage heart failure. His brother has been in and out of the hospital over the past year, with recurrent symptoms leading to his current hospitalization since February. His brother is now in hospice care at the [facility] in [place]. He notes that his brother has a different father, and there is no known genetic disorder related to his brother's condition.  His father passed away from a heart attack, and his brother has a history of PTSD following military service in [place]. He has gained some weight recently, attributing it to stress and recent travel to [place] , where his daughter moved for a new job. He aims to reach a goal weight of 215-220 pounds. Nail disorder left hand- mentioned this end of office visit. 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 Endocrinologist did not think so since it was only the left hand He does not take any medications and prefers to maintain this status. Physical Exam Results Assessment & Plan Graves' disease in remission Graves' disease is in remission. He is not on medication.  General Health Maintenance He is due for a tetanus booster and declines the shingles vaccine. Weight gain noted, exceeding goal weight of 215-220 pounds. - Administer tetanus booster. The 10-year ASCVD risk score (Arnett DK, et al., 2019) is: 6.1%   Values used to calculate the score:  Age: 32 years     Clincally relevant sex: Male     Is Non-Hispanic African American: Yes     Diabetic: No     Tobacco smoker: No     Systolic Blood Pressure: 132 mmHg     Is BP treated: No     HDL Cholesterol: 56 mg/dL     Total Cholesterol: 185 mg/dL The 16-XWRU ASCVD risk score (Arnett DK, et al., 2019) is: 6.2%   Values used to calculate the score:     Age: 67 years     Clincally relevant sex: Male     Is Non-Hispanic African American: Yes     Diabetic: No     Tobacco smoker:  No     Systolic Blood Pressure: 132 mmHg     Is BP treated: No     HDL Cholesterol: 62 mg/dL     Total Cholesterol: 218 mg/dL  Health Maintenance  Topic Date Due   HIV Screening  Never done   Hepatitis C Screening  Never done   COVID-19 Vaccine (2 - 2024-25 season) 11/24/2023*   Zoster (Shingles) Vaccine (1 of 2) 02/08/2024*   Flu Shot  12/23/2023   Colon Cancer Screening  06/18/2029   DTaP/Tdap/Td vaccine (3 - Td or Tdap) 11/07/2033   HPV Vaccine  Aged Out   Meningitis B Vaccine  Aged Out  *Topic was postponed. The date shown is not the original due date.    Most recent fall risk assessment:     No data to display           Most recent depression screenings:    11/08/2023    2:13 PM  PHQ 2/9 Scores  PHQ - 2 Score 0      The 10-year ASCVD risk score (Arnett DK, et al., 2019) is: 6.1%   Values used to calculate the score:     Age: 67 years     Clincally relevant sex: Male     Is Non-Hispanic African American: Yes     Diabetic: No     Tobacco smoker: No     Systolic Blood Pressure: 132 mmHg     Is BP treated: No     HDL Cholesterol: 56 mg/dL     Total Cholesterol: 185 mg/dL  Past Surgical History:  Procedure Laterality Date   INGUINAL HERNIA REPAIR     R/L   INGUINAL HERNIA REPAIR Right 01/03/2020   Procedure: OPEN RIGHT INGUINAL HERNIA REPAIR;  Surgeon: Oza Blumenthal, MD;  Location: [facility] OR;  Service: General;  Laterality: Right;  LMA, TAP BLOCK   INSERTION OF MESH Right 01/03/2020   Procedure: INSERTION OF MESH;  Surgeon: Oza Blumenthal, MD;  Location: [facility] OR;  Service: General;  Laterality: Right;   KNEE ARTHROSCOPY     Left   SHOULDER ARTHROSCOPY     X2 Left   TOE AMPUTATION     right 5th toe   Social History   Tobacco Use   Smoking status: Never   Smokeless tobacco: Never  Vaping Use   Vaping status: Never Used  Substance Use Topics   Alcohol use: Not Currently   Drug use: Never   Social History   Socioeconomic History   Marital  status: Married    Spouse name: Not on file   Number of children: Not on file   Years of education: Not on file   Highest education level: Not on file  Occupational History   Not on file  Tobacco  Use   Smoking status: Never   Smokeless tobacco: Never  Vaping Use   Vaping status: Never Used  Substance and Sexual Activity   Alcohol use: Not Currently   Drug use: Never   Sexual activity: Not on file  Other Topics Concern   Not on file  Social History Narrative   Not on file   Social Drivers of Health   Financial Resource Strain: Low Risk  (11/08/2023)   Overall Financial Resource Strain (CARDIA)    Difficulty of Paying Living Expenses: Not hard at all  Food Insecurity: No Food Insecurity (11/08/2023)   Hunger Vital Sign    Worried About Running Out of Food in the Last Year: Never true    Ran Out of Food in the Last Year: Never true  Transportation Needs: No Transportation Needs (11/08/2023)   PRAPARE - Administrator, Civil Service (Medical): No    Lack of Transportation (Non-Medical): No  Physical Activity: Sufficiently Active (11/08/2023)   Exercise Vital Sign    Days of Exercise per Week: 5 days    Minutes of Exercise per Session: 60 min  Stress: No Stress Concern Present (11/08/2023)   [employer] of Occupational Health - Occupational Stress Questionnaire    Feeling of Stress: Not at all  Social Connections: Unknown (11/08/2023)   Social Connection and Isolation Panel    Frequency of Communication with Friends and Family: More than three times a week    Frequency of Social Gatherings with Friends and Family: More than three times a week    Attends Religious Services: Patient declined    Database administrator or Organizations: Patient declined    Attends Banker Meetings: Patient declined    Marital Status: Married  Catering manager Violence: Not At Risk (11/08/2023)   Humiliation, Afraid, Rape, and Kick questionnaire    Fear of Current or  Ex-Partner: No    Emotionally Abused: No    Physically Abused: No    Sexually Abused: No   Family History  Problem Relation Age of Onset   Glaucoma Mother    Thyroid disease Mother    Heart disease Father    Heart failure Brother    Post-traumatic stress disorder Brother    Colon cancer Neg Hx    Prostate cancer Neg Hx    Allergies  Allergen Reactions   Estonia Nut (Berthollefia [place]) Anaphylaxis, Itching and Other (See Comments)     Patient Care Team: Amadeo June, MD as PCP - General (Family Medicine)   Outpatient Medications Prior to Visit  Medication Sig   [DISCONTINUED] hydrochlorothiazide (HYDRODIURIL) 25 MG tablet Take 25 mg by mouth daily.   [DISCONTINUED] methimazole (TAPAZOLE) 10 MG tablet Take 10 mg by mouth daily.   [DISCONTINUED] oxyCODONE  (OXY IR/ROXICODONE ) 5 MG immediate release tablet Take 1 tablet (5 mg total) by mouth every 6 (six) hours as needed for moderate pain, severe pain or breakthrough pain.   No facility-administered medications prior to visit.    ROS     Objective:    BP 132/82   Pulse 69   Temp 98.2 F (36.8 C)   Ht 6' 2 (1.88 m)   Wt 240 lb (108.9 kg) Comment: clothed  SpO2 99%   BMI 30.81 kg/m  BP Readings from Last 3 Encounters:  11/08/23 132/82  01/03/20 114/79  12/26/19 (!) 145/86   Wt Readings from Last 3 Encounters:  11/08/23 240 lb (108.9 kg)  01/03/20 226 lb (102.5 kg)  12/26/19  226 lb (102.5 kg)    Physical Exam Vitals and nursing note reviewed.  Constitutional:      General: He is not in acute distress.    Appearance: Normal appearance.  HENT:     Head: Normocephalic.     Right Ear: Tympanic membrane, ear canal and external ear normal.     Left Ear: Tympanic membrane, ear canal and external ear normal.     Nose: Nose normal.     Mouth/Throat:     Mouth: Mucous membranes are moist.     Pharynx: Oropharynx is clear.   Eyes:     Extraocular Movements: Extraocular movements intact.      Conjunctiva/sclera: Conjunctivae normal.     Pupils: Pupils are equal, round, and reactive to light.   Neck:     Thyroid: No thyroid mass, thyromegaly or thyroid tenderness.   Cardiovascular:     Rate and Rhythm: Normal rate and regular rhythm.     Heart sounds: Normal heart sounds.  Pulmonary:     Effort: Pulmonary effort is normal.     Breath sounds: Normal breath sounds.  Abdominal:     General: Bowel sounds are normal.     Palpations: Abdomen is soft.     Tenderness: There is no abdominal tenderness. There is no guarding.     Hernia: No hernia is present.  Genitourinary:    Penis: Normal and circumcised. No discharge or lesions.      Testes: Normal.     Epididymis:     Right: Normal.     Left: Normal.     Prostate: Normal. Not enlarged and not tender.     Rectum: Normal. No tenderness.   Musculoskeletal:        General: No tenderness. Normal range of motion.     Cervical back: Normal range of motion.     Right lower leg: No edema.     Left lower leg: No edema.   Skin:    General: Skin is warm and dry.     Comments: Left hand with abnormal nails- patient has whitish discoloration and ridges (right hand unaffected)   Neurological:     General: No focal deficit present.     Mental Status: He is alert and oriented to person, place, and time. Mental status is at baseline.     Cranial Nerves: No cranial nerve deficit.     Sensory: No sensory deficit.     Motor: No weakness.     Coordination: Coordination normal.     Gait: Gait normal.     Deep Tendon Reflexes: Reflexes normal.   Psychiatric:        Mood and Affect: Mood normal.        Behavior: Behavior normal.        Thought Content: Thought content normal.        Judgment: Judgment normal.      No results found for any visits on 11/08/23.      Amadeo June, MD

## 2023-11-09 ENCOUNTER — Ambulatory Visit: Payer: Self-pay | Admitting: Family Medicine

## 2023-11-09 LAB — LIPID PANEL
Cholesterol: 218 mg/dL — ABNORMAL HIGH (ref <200)
HDL: 62 mg/dL (ref >=40)
LDL Cholesterol (Calc): 141 mg/dL — ABNORMAL HIGH
Non-HDL Cholesterol (Calc): 156 mg/dL — ABNORMAL HIGH (ref <130)
Total CHOL/HDL Ratio: 3.5 (calc) (ref <5.0)
Triglycerides: 59 mg/dL (ref <150)

## 2023-11-09 LAB — COMPREHENSIVE METABOLIC PANEL WITH GFR
AG Ratio: 1.3 (calc) (ref 1.0–2.5)
ALT: 18 U/L (ref 9–46)
AST: 20 U/L (ref 10–35)
Albumin: 4.4 g/dL (ref 3.6–5.1)
Alkaline phosphatase (APISO): 56 U/L (ref 35–144)
BUN: 13 mg/dL (ref 7–25)
CO2: 28 mmol/L (ref 20–32)
Calcium: 9.4 mg/dL (ref 8.6–10.3)
Chloride: 101 mmol/L (ref 98–110)
Creat: 0.95 mg/dL (ref 0.70–1.30)
Globulin: 3.5 g/dL (ref 1.9–3.7)
Glucose, Bld: 73 mg/dL (ref 65–99)
Potassium: 4 mmol/L (ref 3.5–5.3)
Sodium: 137 mmol/L (ref 135–146)
Total Bilirubin: 0.7 mg/dL (ref 0.2–1.2)
Total Protein: 7.9 g/dL (ref 6.1–8.1)
eGFR: 96 mL/min/{1.73_m2} (ref >=60)

## 2023-11-09 LAB — CBC WITH DIFFERENTIAL/PLATELET
Absolute Lymphocytes: 1676 {cells}/uL (ref 850–3900)
Absolute Monocytes: 294 {cells}/uL (ref 200–950)
Basophils Absolute: 29 {cells}/uL (ref 0–200)
Basophils Relative: 0.7 %
Eosinophils Absolute: 92 {cells}/uL (ref 15–500)
Eosinophils Relative: 2.2 %
HCT: 45.6 % (ref 38.5–50.0)
Hemoglobin: 15 g/dL (ref 13.2–17.1)
MCH: 26.7 pg — ABNORMAL LOW (ref 27.0–33.0)
MCHC: 32.9 g/dL (ref 32.0–36.0)
MCV: 81.1 fL (ref 80.0–100.0)
MPV: 10.7 fL (ref 7.5–12.5)
Monocytes Relative: 7 %
Neutro Abs: 2108 {cells}/uL (ref 1500–7800)
Neutrophils Relative %: 50.2 %
Platelets: 249 10*3/uL (ref 140–400)
RBC: 5.62 10*6/uL (ref 4.20–5.80)
RDW: 13.5 % (ref 11.0–15.0)
Total Lymphocyte: 39.9 %
WBC: 4.2 10*3/uL (ref 3.8–10.8)

## 2023-11-09 LAB — PSA: PSA: 0.07 ng/mL (ref <=4.00)

## 2023-11-09 LAB — TSH: TSH: 0.46 m[IU]/L (ref 0.40–4.50)

## 2023-11-10 ENCOUNTER — Encounter: Payer: Self-pay | Admitting: Family Medicine

## 2023-11-29 ENCOUNTER — Other Ambulatory Visit: Payer: Self-pay

## 2023-11-29 DIAGNOSIS — I517 Cardiomegaly: Secondary | ICD-10-CM

## 2024-02-17 ENCOUNTER — Other Ambulatory Visit

## 2024-02-24 ENCOUNTER — Other Ambulatory Visit

## 2024-02-24 DIAGNOSIS — E7800 Pure hypercholesterolemia, unspecified: Secondary | ICD-10-CM | POA: Diagnosis not present

## 2024-02-25 LAB — LIPID PANEL
Cholesterol: 195 mg/dL (ref <200)
HDL: 58 mg/dL (ref >=40)
LDL Cholesterol (Calc): 122 mg/dL — ABNORMAL HIGH
Non-HDL Cholesterol (Calc): 137 mg/dL — ABNORMAL HIGH (ref <130)
Total CHOL/HDL Ratio: 3.4 (calc) (ref <5.0)
Triglycerides: 59 mg/dL (ref <150)

## 2024-02-27 ENCOUNTER — Other Ambulatory Visit: Payer: Self-pay

## 2024-02-27 ENCOUNTER — Ambulatory Visit: Payer: Self-pay | Admitting: Family Medicine

## 2024-02-27 DIAGNOSIS — E7800 Pure hypercholesterolemia, unspecified: Secondary | ICD-10-CM

## 2024-02-27 DIAGNOSIS — I517 Cardiomegaly: Secondary | ICD-10-CM

## 2024-03-16 ENCOUNTER — Ambulatory Visit (HOSPITAL_BASED_OUTPATIENT_CLINIC_OR_DEPARTMENT_OTHER)
Admission: RE | Admit: 2024-03-16 | Discharge: 2024-03-16 | Disposition: A | Discharge status: Home / Self Care | Payer: Self-pay | Source: Ambulatory Visit | Attending: Family Medicine | Admitting: Family Medicine

## 2024-03-16 DIAGNOSIS — I517 Cardiomegaly: Secondary | ICD-10-CM | POA: Insufficient documentation

## 2024-03-16 DIAGNOSIS — E7800 Pure hypercholesterolemia, unspecified: Secondary | ICD-10-CM | POA: Insufficient documentation

## 2024-03-19 ENCOUNTER — Ambulatory Visit: Payer: Self-pay | Admitting: Family Medicine

## 2024-06-18 ENCOUNTER — Ambulatory Visit: Admitting: Physician Assistant
